# Patient Record
Sex: Male | Born: 1990 | Race: White | Hispanic: No | Marital: Single | Smoking: Never smoker
Health system: Southern US, Community
[De-identification: ages and names within clinical notes are randomized; demographics above are authoritative.]

---
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
## 2013-11-03 ENCOUNTER — Ambulatory Visit (INDEPENDENT_AMBULATORY_CARE_PROVIDER_SITE_OTHER): Payer: 59 | Admitting: Family Medicine

## 2013-11-03 ENCOUNTER — Encounter: Payer: Self-pay | Admitting: Family Medicine

## 2013-11-03 VITALS — BP 110/76 | Temp 98.6°F | Ht 70.5 in | Wt 150.4 lb

## 2013-11-03 DIAGNOSIS — J329 Chronic sinusitis, unspecified: Secondary | ICD-10-CM

## 2013-11-03 MED ORDER — AZITHROMYCIN 250 MG PO TABS: ORAL_TABLET | ORAL | Status: DC

## 2013-11-03 NOTE — Progress Notes (Signed)
   Subjective:    Patient ID: Johnathan Love, male    DOB: [birth year], 23 y.o.   MRN: [id number]  Sinusitis This is a new problem. The current episode started in the past 7 days. The problem is unchanged. There has been no fever. His pain is at a severity of 5/10. The pain is moderate. Associated symptoms include coughing, headaches and a sore throat. Past treatments include oral decongestants. The treatment provided no relief.    Throat pain four d ago,  Got worse thru the day  Nose running head hurting  tok cold and sinus med  Trouble sleeping  No energy  No fever Feels weak, throat pain, cough not bad  Some exposure to sickness,   Review of Systems  HENT: Positive for sore throat.   Respiratory: Positive for cough.   Neurological: Positive for headaches.   no vomiting or diarrhea ROS otherwise negative     Objective:   Physical Exam  Alert mild malaise. Moderate nasal congestion. Frontal tenderness. Pharynx erythematous neck supple. Lungs clear heart regular rate and rhythm.      Assessment & Plan:  Impression 1 acute rhinosinusitis discussed plan antibiotics prescribed. Symptomatic care discussed. Warning signs discussed. WSL

## 2013-11-04 ENCOUNTER — Other Ambulatory Visit: Payer: Self-pay | Admitting: *Deleted

## 2013-11-04 MED ORDER — AZITHROMYCIN 250 MG PO TABS: ORAL_TABLET | ORAL | Status: AC

## 2014-01-21 ENCOUNTER — Ambulatory Visit: Payer: 59 | Admitting: Family Medicine

## 2017-12-03 DIAGNOSIS — E291 Testicular hypofunction: Secondary | ICD-10-CM | POA: Diagnosis not present

## 2017-12-03 DIAGNOSIS — N5201 Erectile dysfunction due to arterial insufficiency: Secondary | ICD-10-CM | POA: Diagnosis not present

## 2017-12-03 DIAGNOSIS — I891 Lymphangitis: Secondary | ICD-10-CM | POA: Diagnosis not present

## 2018-07-01 DIAGNOSIS — Z23 Encounter for immunization: Secondary | ICD-10-CM | POA: Diagnosis not present
